# Patient Record
Sex: Female | Born: 1960 | Race: Black or African American | Hispanic: No | Marital: Single | Smoking: Current every day smoker
Health system: Southern US, Community
[De-identification: ages and names within clinical notes are randomized; demographics above are authoritative.]

## PROBLEM LIST (undated history)

## (undated) DIAGNOSIS — F319 Bipolar disorder, unspecified: Secondary | ICD-10-CM

## (undated) DIAGNOSIS — B20 Human immunodeficiency virus [HIV] disease: Secondary | ICD-10-CM

## (undated) DIAGNOSIS — I10 Essential (primary) hypertension: Secondary | ICD-10-CM

## (undated) HISTORY — PX: APPENDECTOMY: SHX54

## (undated) HISTORY — PX: ABDOMINAL HYSTERECTOMY: SHX81

---

## 2005-06-15 ENCOUNTER — Inpatient Hospital Stay (HOSPITAL_COMMUNITY): Admission: AD | Admit: 2005-06-15 | Discharge: 2005-06-21 | Payer: Self-pay | Admitting: Psychiatry

## 2005-06-16 ENCOUNTER — Ambulatory Visit: Payer: Self-pay | Admitting: Psychiatry

## 2005-10-15 ENCOUNTER — Ambulatory Visit: Payer: Self-pay | Admitting: *Deleted

## 2005-10-15 ENCOUNTER — Inpatient Hospital Stay (HOSPITAL_COMMUNITY): Admission: AD | Admit: 2005-10-15 | Discharge: 2005-10-20 | Payer: Self-pay | Admitting: *Deleted

## 2005-10-18 ENCOUNTER — Ambulatory Visit: Payer: Self-pay | Admitting: Family Medicine

## 2005-10-18 ENCOUNTER — Observation Stay (HOSPITAL_COMMUNITY): Admission: EM | Admit: 2005-10-18 | Discharge: 2005-10-19 | Payer: Self-pay | Admitting: Emergency Medicine

## 2008-05-03 IMAGING — CR DG CHEST 2V
2 series · 2 of 2 positions shown · non-contrast
Comparison: None.

CLINICAL DATA: Chest and shortness of breath.  Smoker. 
 CHEST - 2 VIEW:

[w chest pa]
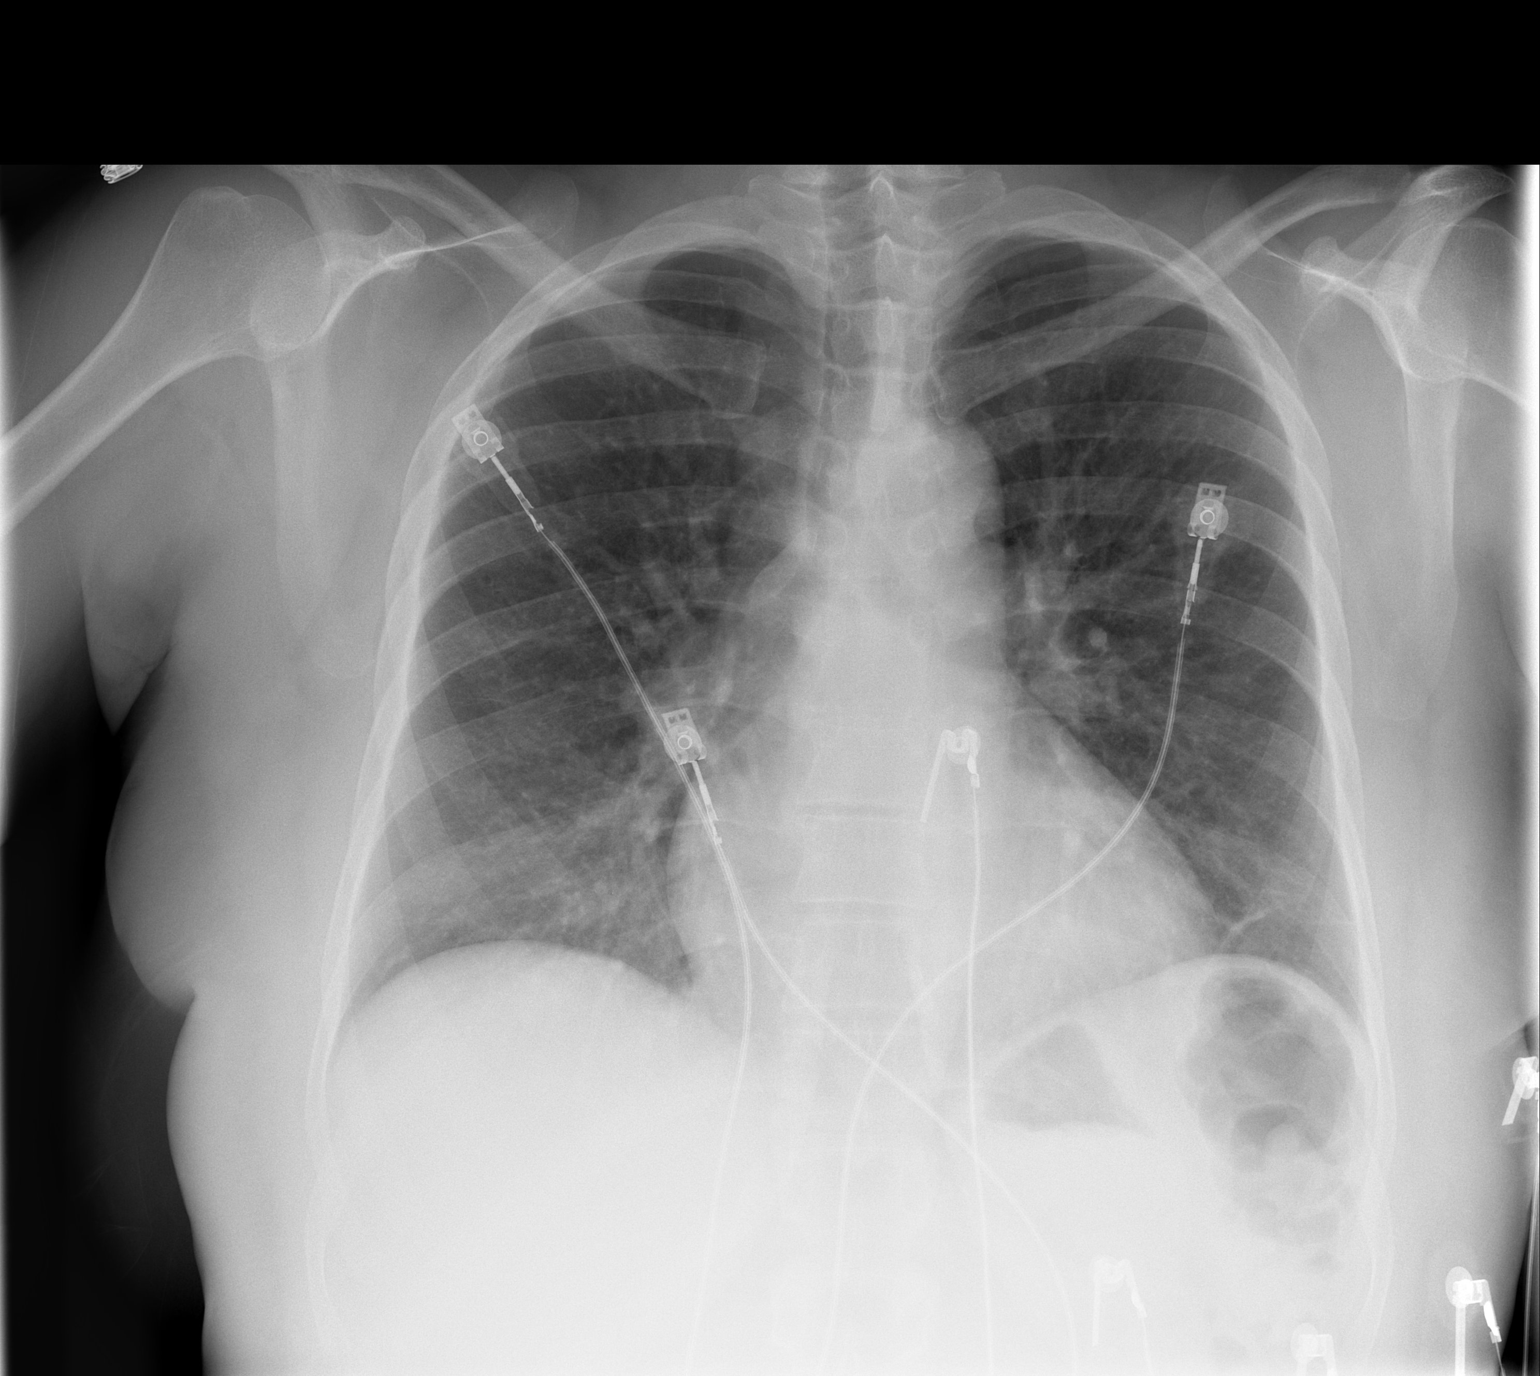

[w chest lat]
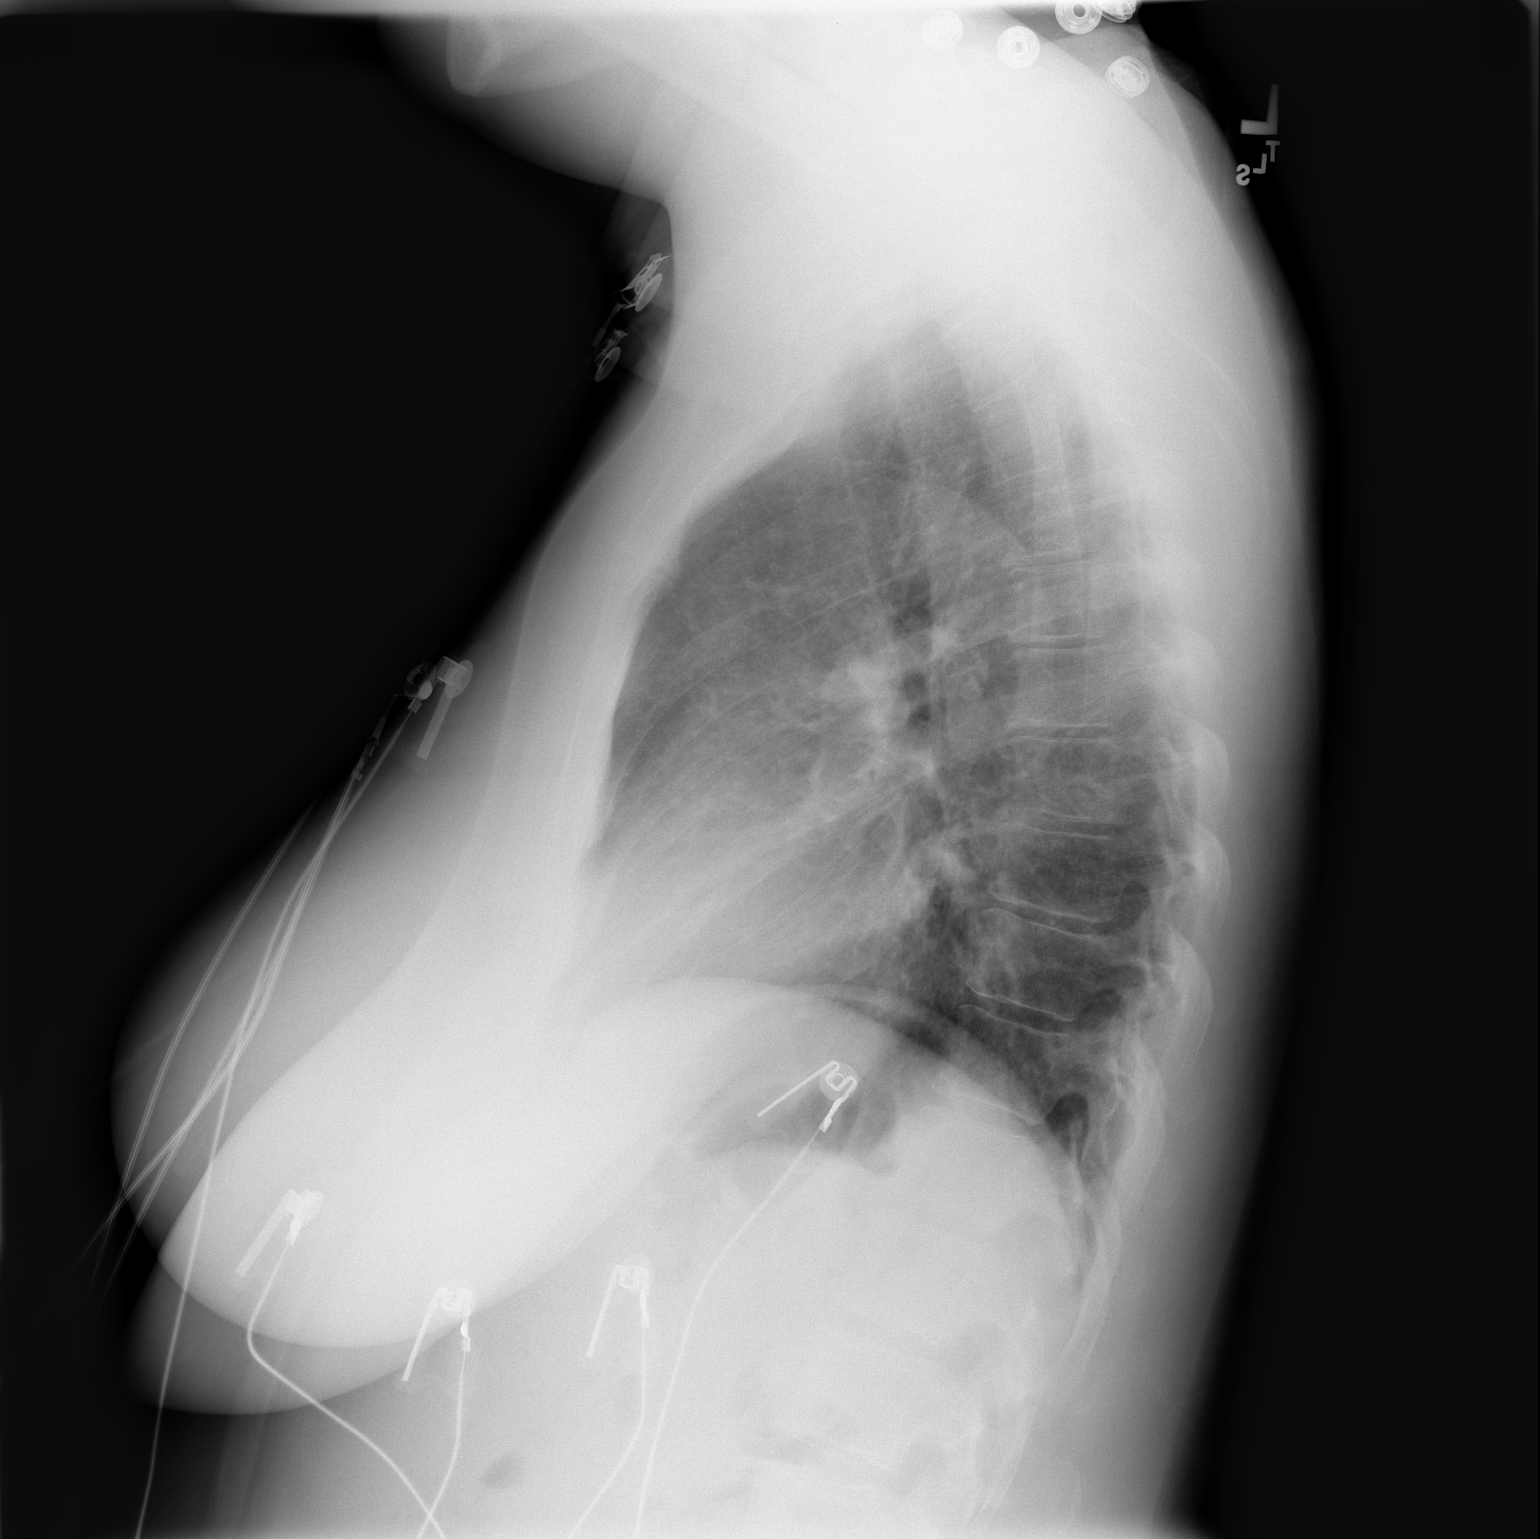

[2 of 2 positions shown; findings below may reference images not displayed]

FINDINGS: Heart size is normal.  There is no effusions.  No air space opacities noted.  There is mild atelectasis at both lung bases.
IMPRESSION: Bibasilar atelectasis.

## 2016-12-28 ENCOUNTER — Emergency Department (HOSPITAL_COMMUNITY)
Admission: EM | Admit: 2016-12-28 | Discharge: 2016-12-28 | Disposition: A | Discharge status: Home / Self Care | Payer: 59 | Attending: Emergency Medicine | Admitting: Emergency Medicine

## 2016-12-28 ENCOUNTER — Emergency Department (HOSPITAL_COMMUNITY): Payer: 59

## 2016-12-28 ENCOUNTER — Encounter (HOSPITAL_COMMUNITY): Payer: Self-pay | Admitting: Emergency Medicine

## 2016-12-28 ENCOUNTER — Other Ambulatory Visit: Payer: Self-pay

## 2016-12-28 DIAGNOSIS — R45851 Suicidal ideations: Secondary | ICD-10-CM | POA: Insufficient documentation

## 2016-12-28 DIAGNOSIS — F319 Bipolar disorder, unspecified: Secondary | ICD-10-CM | POA: Diagnosis not present

## 2016-12-28 DIAGNOSIS — Z21 Asymptomatic human immunodeficiency virus [HIV] infection status: Secondary | ICD-10-CM | POA: Insufficient documentation

## 2016-12-28 DIAGNOSIS — R079 Chest pain, unspecified: Secondary | ICD-10-CM | POA: Diagnosis present

## 2016-12-28 DIAGNOSIS — I10 Essential (primary) hypertension: Secondary | ICD-10-CM | POA: Diagnosis not present

## 2016-12-28 DIAGNOSIS — F17210 Nicotine dependence, cigarettes, uncomplicated: Secondary | ICD-10-CM | POA: Insufficient documentation

## 2016-12-28 HISTORY — DX: Human immunodeficiency virus (HIV) disease: B20

## 2016-12-28 HISTORY — DX: Bipolar disorder, unspecified: F31.9

## 2016-12-28 HISTORY — DX: Essential (primary) hypertension: I10

## 2016-12-28 LAB — BASIC METABOLIC PANEL
Anion gap: 14 (ref 5–15)
BUN: 11 mg/dL (ref 6–20)
CO2: 22 mmol/L (ref 22–32)
Calcium: 9.5 mg/dL (ref 8.9–10.3)
Chloride: 100 mmol/L — ABNORMAL LOW (ref 101–111)
Creatinine, Ser: 1.14 mg/dL — ABNORMAL HIGH (ref 0.44–1.00)
GFR calc Af Amer: >60 mL/min (ref >60)
GFR calc non Af Amer: 53 mL/min — ABNORMAL LOW (ref >60)
Glucose, Bld: 161 mg/dL — ABNORMAL HIGH (ref 65–99)
Potassium: 3.7 mmol/L (ref 3.5–5.1)
Sodium: 136 mmol/L (ref 135–145)

## 2016-12-28 LAB — CBC
HCT: 42.9 % (ref 36.0–46.0)
Hemoglobin: 14.1 g/dL (ref 12.0–15.0)
MCH: 28.4 pg (ref 26.0–34.0)
MCHC: 32.9 g/dL (ref 30.0–36.0)
MCV: 86.3 fL (ref 78.0–100.0)
Platelets: 404 10*3/uL — ABNORMAL HIGH (ref 150–400)
RBC: 4.97 MIL/uL (ref 3.87–5.11)
RDW: 17.8 % — ABNORMAL HIGH (ref 11.5–15.5)
WBC: 18.2 10*3/uL — ABNORMAL HIGH (ref 4.0–10.5)

## 2016-12-28 LAB — I-STAT TROPONIN, ED: Troponin i, poc: 0 ng/mL (ref 0.00–0.08)

## 2016-12-28 MED ORDER — IBUPROFEN 400 MG PO TABS
600.0000 mg | ORAL_TABLET | Freq: Once | ORAL | Status: AC
  Administered 2016-12-28: 600 mg via ORAL
  Filled 2016-12-28: qty 1

## 2016-12-28 MED ORDER — IPRATROPIUM-ALBUTEROL 0.5-2.5 (3) MG/3ML IN SOLN
3.0000 mL | Freq: Once | RESPIRATORY_TRACT | Status: AC
  Administered 2016-12-28: 3 mL via RESPIRATORY_TRACT
  Filled 2016-12-28: qty 3

## 2016-12-28 MED ORDER — DEXAMETHASONE 4 MG PO TABS
12.0000 mg | ORAL_TABLET | Freq: Once | ORAL | Status: AC
  Administered 2016-12-28: 12 mg via ORAL
  Filled 2016-12-28: qty 3

## 2016-12-28 NOTE — ED Triage Notes (Signed)
Patient arrives via [facility] with complaint of chest pain that started while being transported to [facility] for admission for suicidal ideations. Patient complains of a tightness in epigastrum with radiation of discomfort into left arm. Patient had been diagnosed with non-cardiac chest pain at [facility], patient states the pain is worse now. Alert and oriented at this time, sinus tach with rate in 130's.

## 2016-12-28 NOTE — Progress Notes (Signed)
CSW spoke with Rosey Batheresa from [facility] and was informed that they will hold pt's bed until pt has been medically cleared. Rosey Batheresa asked that paperwork be faxed over showing that pt has been medically cleared. CSW will fax [phone] over AVS at the time of discharge. CSW has provide pt's RN with IVC paperwork at this time and was informed by Clincal Director that as long as [paperowrk was not expired then there is no need to have [facility] paperwork switched to [facility].    Claude MangesKierra S. Katreena Schupp, MSW, LCSW-A Emergency Department Clinical Social Worker [phone]

## 2016-12-28 NOTE — ED Notes (Signed)
[facility] already [facility]

## 2016-12-28 NOTE — ED Notes (Signed)
Patient transported to X-ray 

## 2016-12-28 NOTE — ED Provider Notes (Signed)
[facility] EM[facility]ENCY [facility] Provider Note   CSN: [id number] Arrival date & time: 12/28/16  1402     History   Chief Complaint Chief Complaint  Patient presents with  . Chest Pain    HPI Rachel Berg is a 56 y.o. female.  HPI   56 year old female with chest pain.  Patient apparently was just discharged from [facility] [place] room in [place] and is being transported to [facility] for psychiatric reasons.  IVC paperwork stating that she is suicidal with thoughts to shoot herself and she has access to firearms.  In route she began complaining of chest pain so they brought her here. Her chest feels tight. Says this actually began when in the ED at [facility] earlier this morning. Hx of COPD and thinks this may be related. Does feels SOB.    Past Medical History:  Diagnosis Date  . Bipolar 1 disorder (HCC)   . HIV (human immunodeficiency virus infection) (HCC)   . Hypertension     There are no active problems to display for this patient.   Past Surgical History:  Procedure Laterality Date  . ABDOMINAL HYSTERECTOMY    . APPENDECTOMY    . CESAREAN SECTION      OB History    No data available       Home Medications    Prior to Admission medications   Not on File    Family History No family history on file.  Social History Social History   Tobacco Use  . Smoking status: Current Every Day Smoker    Packs/day: 0.50    Types: Cigarettes  Substance Use Topics  . Alcohol use: No    Frequency: Never  . Drug use: No     Allergies   Lisinopril   Review of Systems Review of Systems  All systems reviewed and negative, other than as noted in HPI.  Physical Exam Updated Vital Signs There were no vitals taken for this visit.  Physical Exam  Constitutional: She is oriented to person, place, and time. She appears well-developed and well-nourished. No distress.  HENT:  Head: Normocephalic and atraumatic.  Eyes: Conjunctivae are  normal. Right eye exhibits no discharge. Left eye exhibits no discharge.  Neck: Neck supple.  Cardiovascular: Normal rate, regular rhythm and normal heart sounds. Exam reveals no gallop and no friction rub.  No murmur heard. Pulmonary/Chest: Effort normal and breath sounds normal. No respiratory distress.  Abdominal: Soft. She exhibits no distension. There is no tenderness.  Musculoskeletal: She exhibits no edema or tenderness.  Neurological: She is alert and oriented to person, place, and time.  Skin: Skin is warm and dry.  Psychiatric: Thought content normal.  Anxious. Speech somewhat pressured. Content appropriate. Doesn't appear to be responding to internal stimuli.   Nursing note and vitals reviewed.    ED Treatments / Results  Labs (all labs ordered are listed, but only abnormal results are displayed) Labs Reviewed  BASIC METABOLIC PANEL - Abnormal; Notable for the following components:      Result Value   Chloride 100 (*)    Glucose, Bld 161 (*)    Creatinine, Ser 1.14 (*)    GFR calc non Af Amer 53 (*)    All other components within normal limits  CBC - Abnormal; Notable for the following components:   WBC 18.2 (*)    RDW 17.8 (*)    Platelets 404 (*)    All other components within normal limits  I-STAT  TROPONIN, ED    EKG  EKG Interpretation  Date/Time:  Tuesday December 28 2016 14:08:43 EST Ventricular Rate:  123 PR Interval:  134 QRS Duration: 78 QT Interval:  336 QTC Calculation: 481 R Axis:   -23 Text Interpretation:  Sinus tachycardia Anterior infarct , age undetermined Abnormal ECG Since last EKG, rate has increased Confirmed by Shaune PollackIsaacs, Cameron [phone]([id number]) on 12/28/2016 2:48:09 PM       Radiology Dg Chest 2 View  Result Date: 12/28/2016 CLINICAL DATA:  Chest pain today. EXAM: CHEST  2 VIEW COMPARISON:  PA and lateral chest 10/17/2005. FINDINGS: Small focus of linear scar in the left lung base is unchanged. Lungs are otherwise clear. Heart size is  normal. No pneumothorax pleural effusion. No acute bony abnormality. IMPRESSION: No acute disease. Electronically Signed   By: Drusilla Kannerhomas  Dalessio M.D.   On: 12/28/2016 15:22    Procedures Procedures (including critical care time)  Medications Ordered in ED Medications  ipratropium-albuterol (DUONEB) 0.5-2.5 (3) MG/3ML nebulizer solution 3 mL (not administered)     Initial Impression / Assessment and Plan / ED Course  I have reviewed the triage vital signs and the nursing notes.  Pertinent labs & imaging results that were available during my care of the patient were reviewed by me and considered in my medical decision making (see chart for details).     W/u here fairly unremarkable. Has leukocytosis, but may have potetentially had steroids while at [facility]? Afebrile. CXR clear. Does have some wheezing on exam but no respiratory distress. Will give neb. Ibuprofen for pain. Troponin normal with pain since this am.   Received ED note form [facility]. Documenting that she had been complaining of 3d of CP. Per note, cardiac catheterization 05/30/15 w/o significant CAD and EF 60%. Their impression was that this is benign CP. Troponin was normal. I did not see where she had been given steroids. Physical exam did not note wheezing.   I also have a low suspicion for ACS. Doubt PE, dissection or other emergent process. Troponin remains normal. CXR again w/o acute findings.  [facility].    Final Clinical Impressions(s) / ED Diagnoses   Final diagnoses:  Chest pain, unspecified type  Suicidal ideation    ED Discharge Orders    None       Raeford RazorKohut, Peniel Hass, MD 12/28/16 1728

## 2016-12-28 NOTE — ED Notes (Signed)
Pt is highly upset--demanding pain medication for her CP and requesting to speak to the charge nurse.

## 2016-12-28 NOTE — ED Notes (Signed)
Pt given [place] sandwich and is eating at this time

## 2019-07-15 IMAGING — CR DG CHEST 2V
2 series · 2 of 2 positions shown · non-contrast
Comparison: PA and lateral chest 10/17/2005.

CLINICAL DATA: Chest pain today.

EXAM:
CHEST  2 VIEW

[chest pa]
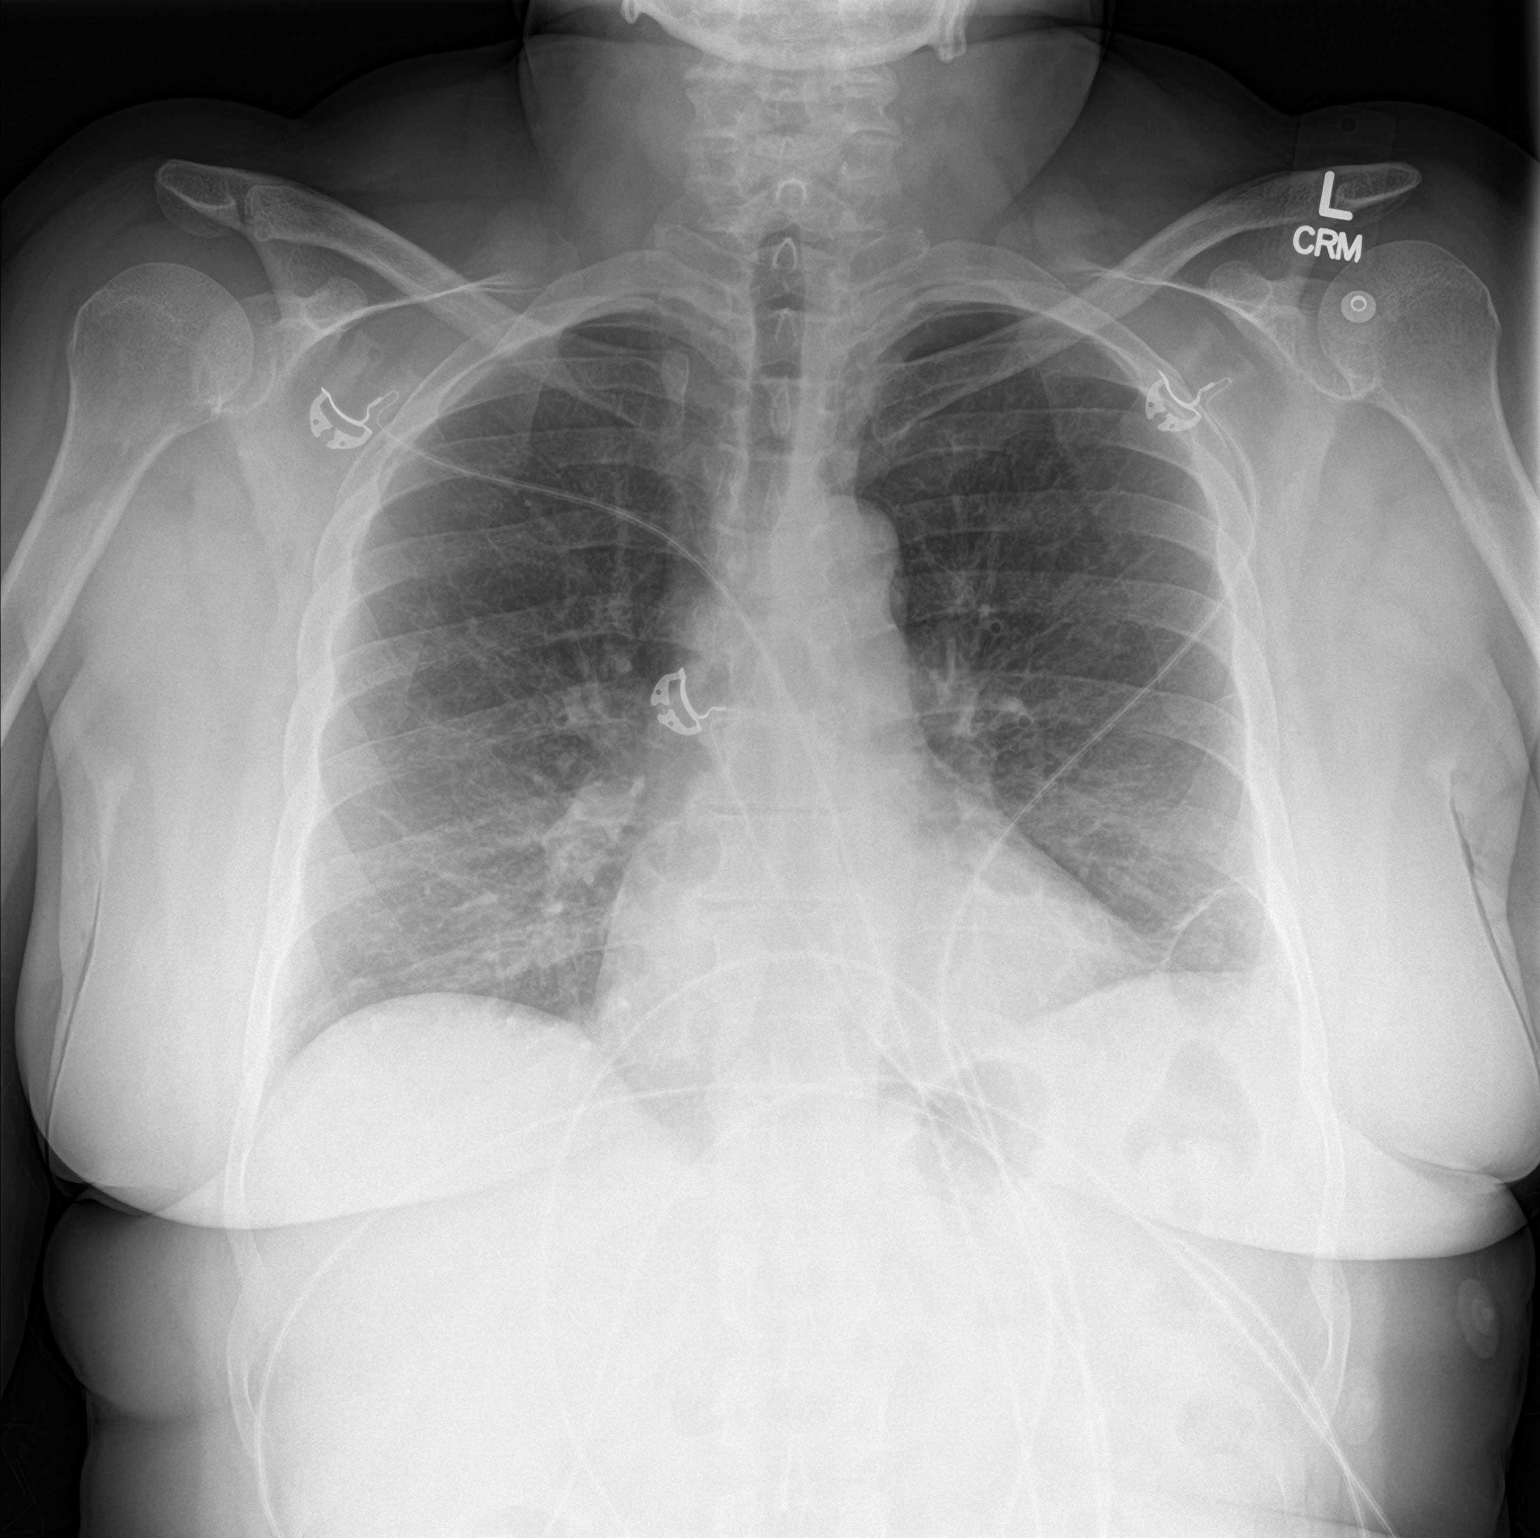

[chest lat]
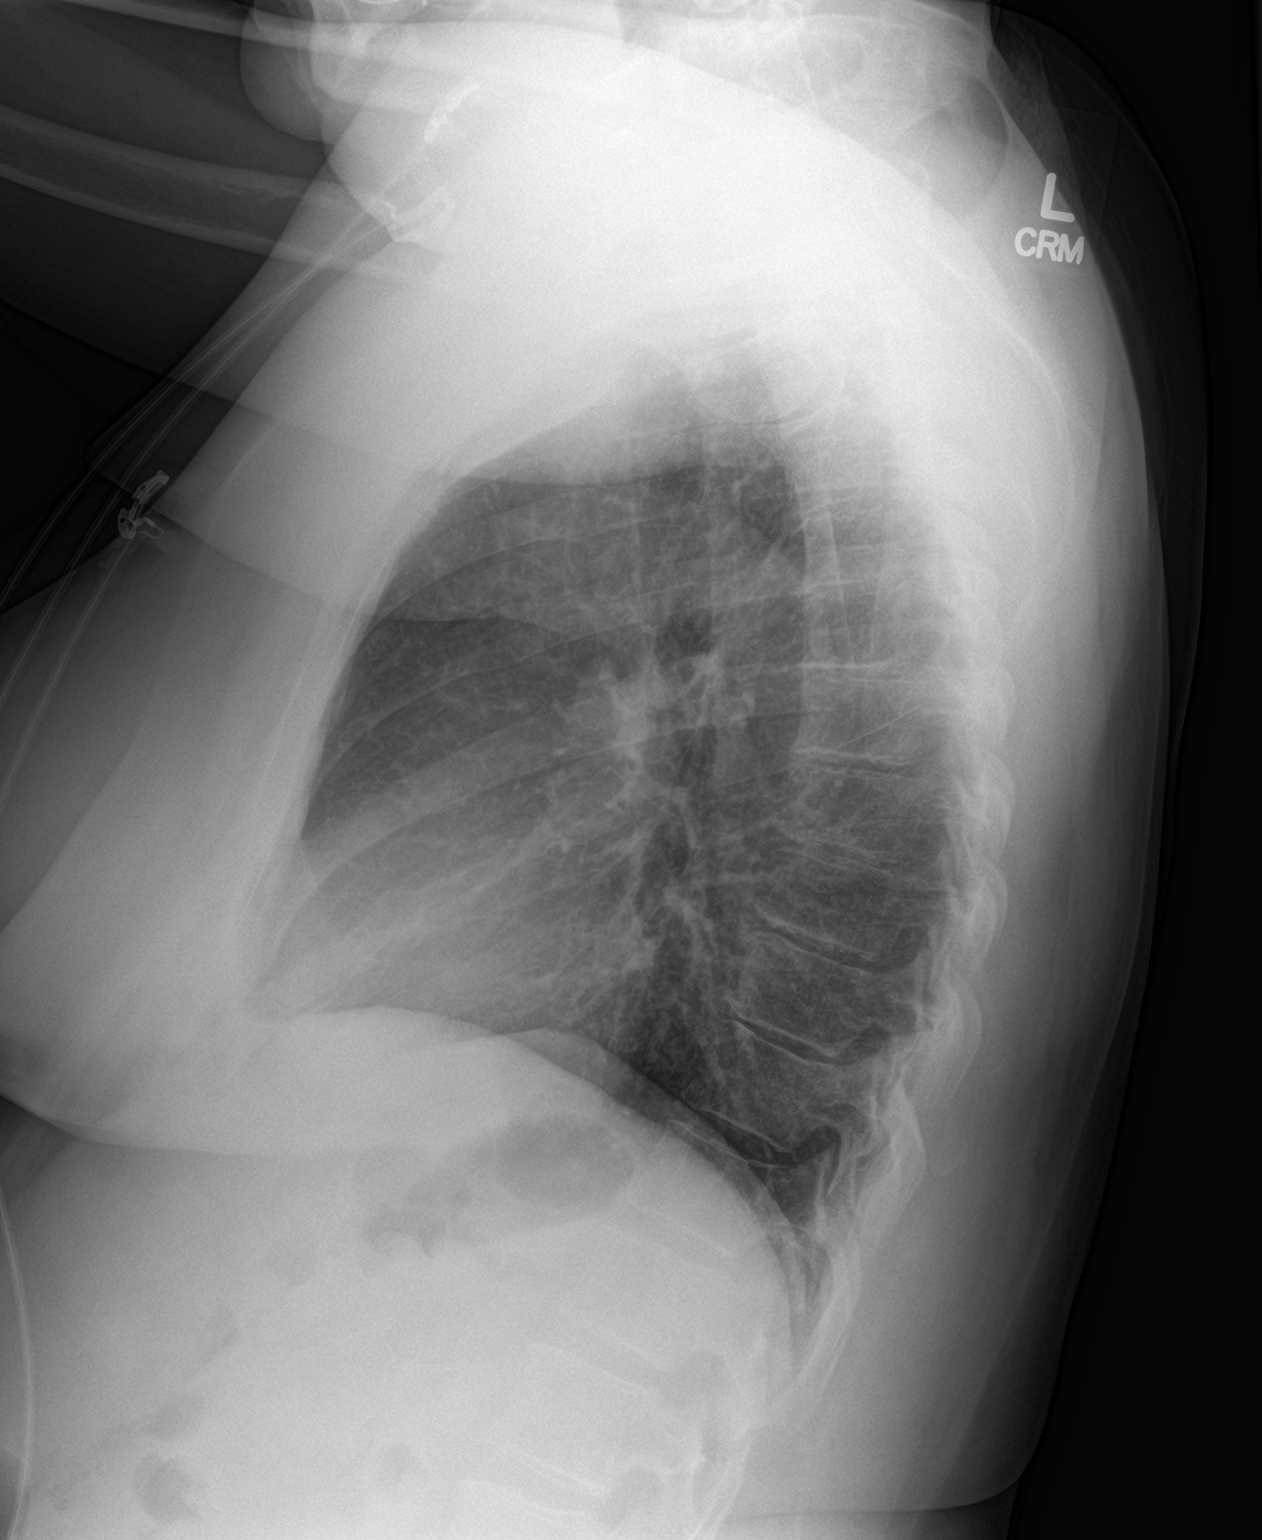

[2 of 2 positions shown; findings below may reference images not displayed]

FINDINGS: Small focus of linear scar in the left lung base is unchanged. Lungs
are otherwise clear. Heart size is normal. No pneumothorax pleural
effusion. No acute bony abnormality.
IMPRESSION: No acute disease.
